# Patient Record
Sex: Female | Born: 1940
Health system: Southern US, Community
[De-identification: ages and names within clinical notes are randomized; demographics above are authoritative.]

## PROBLEM LIST (undated history)

## (undated) DIAGNOSIS — R072 Precordial pain: Secondary | ICD-10-CM

## (undated) DIAGNOSIS — R0602 Shortness of breath: Secondary | ICD-10-CM

## (undated) DIAGNOSIS — K219 Gastro-esophageal reflux disease without esophagitis: Secondary | ICD-10-CM

## (undated) DIAGNOSIS — M549 Dorsalgia, unspecified: Secondary | ICD-10-CM

## (undated) HISTORY — DX: Shortness of breath: R06.02

## (undated) HISTORY — DX: Dorsalgia, unspecified: M54.9

## (undated) HISTORY — PX: TONSILLECTOMY: SUR1361

## (undated) HISTORY — DX: Gastro-esophageal reflux disease without esophagitis: K21.9

## (undated) HISTORY — DX: Precordial pain: R07.2

---

## 1997-10-09 ENCOUNTER — Ambulatory Visit (HOSPITAL_COMMUNITY): Admission: RE | Admit: 1997-10-09 | Discharge: 1997-10-09 | Payer: Self-pay | Admitting: Obstetrics and Gynecology

## 1998-07-09 ENCOUNTER — Ambulatory Visit (HOSPITAL_COMMUNITY): Admission: RE | Admit: 1998-07-09 | Discharge: 1998-07-09 | Payer: Self-pay | Admitting: Family Medicine

## 1998-07-09 ENCOUNTER — Encounter: Payer: Self-pay | Admitting: Family Medicine

## 1999-01-21 ENCOUNTER — Ambulatory Visit (HOSPITAL_COMMUNITY): Admission: RE | Admit: 1999-01-21 | Discharge: 1999-01-21 | Payer: Self-pay | Admitting: Obstetrics and Gynecology

## 1999-01-21 ENCOUNTER — Encounter: Payer: Self-pay | Admitting: Obstetrics and Gynecology

## 1999-05-12 ENCOUNTER — Encounter: Payer: Self-pay | Admitting: Cardiology

## 1999-07-26 ENCOUNTER — Other Ambulatory Visit: Admission: RE | Admit: 1999-07-26 | Discharge: 1999-07-26 | Payer: Self-pay | Admitting: Obstetrics and Gynecology

## 2000-08-14 ENCOUNTER — Encounter: Payer: Self-pay | Admitting: Obstetrics and Gynecology

## 2000-08-14 ENCOUNTER — Ambulatory Visit (HOSPITAL_COMMUNITY): Admission: RE | Admit: 2000-08-14 | Discharge: 2000-08-14 | Payer: Self-pay | Admitting: Obstetrics and Gynecology

## 2001-01-23 ENCOUNTER — Inpatient Hospital Stay (HOSPITAL_COMMUNITY): Admission: EM | Admit: 2001-01-23 | Discharge: 2001-01-24 | Payer: Self-pay | Admitting: Emergency Medicine

## 2001-01-23 ENCOUNTER — Encounter: Payer: Self-pay | Admitting: Emergency Medicine

## 2001-01-24 ENCOUNTER — Encounter: Payer: Self-pay | Admitting: Internal Medicine

## 2001-08-22 ENCOUNTER — Ambulatory Visit (HOSPITAL_COMMUNITY): Admission: RE | Admit: 2001-08-22 | Discharge: 2001-08-22 | Payer: Self-pay | Admitting: Obstetrics and Gynecology

## 2001-08-22 ENCOUNTER — Encounter: Payer: Self-pay | Admitting: Obstetrics and Gynecology

## 2002-10-21 ENCOUNTER — Ambulatory Visit (HOSPITAL_COMMUNITY): Admission: RE | Admit: 2002-10-21 | Discharge: 2002-10-21 | Payer: Self-pay | Admitting: Internal Medicine

## 2002-10-21 ENCOUNTER — Encounter: Payer: Self-pay | Admitting: Obstetrics and Gynecology

## 2003-10-12 ENCOUNTER — Ambulatory Visit (HOSPITAL_COMMUNITY): Admission: RE | Admit: 2003-10-12 | Discharge: 2003-10-12 | Payer: Self-pay | Admitting: Gastroenterology

## 2004-12-26 ENCOUNTER — Ambulatory Visit (HOSPITAL_COMMUNITY): Admission: RE | Admit: 2004-12-26 | Discharge: 2004-12-26 | Payer: Self-pay | Admitting: Obstetrics and Gynecology

## 2006-02-15 ENCOUNTER — Ambulatory Visit (HOSPITAL_COMMUNITY): Admission: RE | Admit: 2006-02-15 | Discharge: 2006-02-15 | Payer: Self-pay | Admitting: Obstetrics and Gynecology

## 2008-01-06 ENCOUNTER — Ambulatory Visit (HOSPITAL_COMMUNITY): Admission: RE | Admit: 2008-01-06 | Discharge: 2008-01-06 | Payer: Self-pay | Admitting: Obstetrics & Gynecology

## 2008-03-17 ENCOUNTER — Encounter: Payer: Self-pay | Admitting: Cardiology

## 2008-09-15 ENCOUNTER — Encounter: Payer: Self-pay | Admitting: Cardiology

## 2008-10-05 ENCOUNTER — Encounter (INDEPENDENT_AMBULATORY_CARE_PROVIDER_SITE_OTHER): Payer: Self-pay | Admitting: *Deleted

## 2008-10-05 ENCOUNTER — Telehealth (INDEPENDENT_AMBULATORY_CARE_PROVIDER_SITE_OTHER): Payer: Self-pay | Admitting: *Deleted

## 2008-10-05 ENCOUNTER — Ambulatory Visit: Payer: Self-pay | Admitting: Cardiology

## 2008-10-05 DIAGNOSIS — R072 Precordial pain: Secondary | ICD-10-CM | POA: Insufficient documentation

## 2008-10-05 DIAGNOSIS — R0602 Shortness of breath: Secondary | ICD-10-CM | POA: Insufficient documentation

## 2008-10-05 DIAGNOSIS — M549 Dorsalgia, unspecified: Secondary | ICD-10-CM | POA: Insufficient documentation

## 2008-10-05 DIAGNOSIS — E785 Hyperlipidemia, unspecified: Secondary | ICD-10-CM | POA: Insufficient documentation

## 2008-10-06 ENCOUNTER — Ambulatory Visit: Payer: Self-pay

## 2008-10-06 ENCOUNTER — Ambulatory Visit: Payer: Self-pay | Admitting: Cardiology

## 2008-10-06 ENCOUNTER — Encounter: Payer: Self-pay | Admitting: Cardiology

## 2008-10-12 LAB — CONVERTED CEMR LAB: Pro B Natriuretic peptide (BNP): 102 pg/mL — ABNORMAL HIGH (ref 0.0–100.0)

## 2008-11-10 ENCOUNTER — Ambulatory Visit: Payer: Self-pay | Admitting: Cardiology

## 2008-11-16 ENCOUNTER — Encounter: Payer: Self-pay | Admitting: Cardiology

## 2008-11-16 ENCOUNTER — Ambulatory Visit: Payer: Self-pay | Admitting: Cardiology

## 2009-02-12 ENCOUNTER — Ambulatory Visit (HOSPITAL_COMMUNITY): Admission: RE | Admit: 2009-02-12 | Discharge: 2009-02-12 | Payer: Self-pay | Admitting: Obstetrics & Gynecology

## 2010-01-27 ENCOUNTER — Other Ambulatory Visit: Payer: Self-pay | Admitting: Obstetrics and Gynecology

## 2010-01-27 DIAGNOSIS — Z0000 Encounter for general adult medical examination without abnormal findings: Secondary | ICD-10-CM

## 2010-02-16 ENCOUNTER — Ambulatory Visit (HOSPITAL_COMMUNITY)
Admission: RE | Admit: 2010-02-16 | Discharge: 2010-02-16 | Disposition: A | Discharge status: Home / Self Care | Payer: Medicare Other | Source: Ambulatory Visit | Attending: Obstetrics and Gynecology | Admitting: Obstetrics and Gynecology

## 2010-02-16 ENCOUNTER — Ambulatory Visit (HOSPITAL_COMMUNITY): Admission: RE | Admit: 2010-02-16 | Payer: Self-pay | Source: Home / Self Care | Admitting: Obstetrics and Gynecology

## 2010-02-16 DIAGNOSIS — Z0000 Encounter for general adult medical examination without abnormal findings: Secondary | ICD-10-CM

## 2010-02-16 DIAGNOSIS — Z1231 Encounter for screening mammogram for malignant neoplasm of breast: Secondary | ICD-10-CM | POA: Insufficient documentation

## 2010-05-27 NOTE — Consult Note (Signed)
[place]. [facility]  Patient:    GETHSEMANE, FISCHLER Visit Number: [id number] MRN: [id number]          Service Type: MED Location: [phone] Attending Physician:  Miguel Aschoff Dictated by:   Darci Needle, M.D. Proc. Date: 01/24/01 Admit Date:  01/23/2001 Discharge Date: 01/24/2001   CC:         Al Decant. Janey Greaser, M.D.  Rosanne Sack, M.D.   Consultation Report  CONCLUSIONS: 1. Severe chest pain radiating through to the back, etiology uncertain.    Negative exercise treadmill test in 1999 - Dr. Corliss Marcus. 2. History of gastroesophageal reflux disease.  RECOMMENDATIONS: 1. Stress Cardiolite today.  Will need to discontinue the nitroglycerin paste    prior to the Cardiolite. 2. Aspirin for now empiric instead of adding antithrombotic therapy, based    upon clinical course and findings of Cardiolite. 3. Dr. Corliss Marcus will follow up later today pending the Cardiolite results.  COMMENTS:  The patient is 70 years of age and was brought to the emergency room after suddenly developing a severe chest discomfort that radiated through to her back.  She otherwise poorly characterizes the discomfort other than to say that is was severe.  There was no radiation to the arms and neck.  There was no shortness of breath.  Lying down seemed to make it feel better.  There was no respiratory component to the discomfort.  The discomfort lasted noticeably until she arrived in the emergency room.  Eventually she received a nitroglycerin and within 10-15 minutes after that the discomfort had resolved. EKGs were normal.  She was seen by Dr. Jamie Brookes, who admitted her to the hospital.  A CT scan did not reveal evidence of pulmonary emboli or aortic dissection.  She has no personal history of hypertension, diabetes, cigarette smoking, or hypercholesterolemia.  ALLERGIES:  PENICILLIN.  PHYSICAL EXAMINATION:  GENERAL:  Patient is lying flat in  bed.  Her color is good.  VITAL SIGNS:  Respirations 16, heart rate 80, blood pressure 140/70.  NECK:  The neck veins are not distended.  No carotid bruits are heard.  The carotid upstroke is 2+ bilaterally.  LUNGS:  Clear to auscultation and percussion.  CARDIAC:  Reveals no click, no rub, no gallop.  ABDOMEN:  Soft.  EXTREMITIES:  Reveal no edema.  Femoral pulses are 2+.  LABORATORY DATA:  EKG is normal x 3.  CK-MB and troponin I are normal.  Chest x-ray is unremarkable. Dictated by:   Darci Needle, M.D. Attending Physician:  Miguel Aschoff DD:  01/24/01 TD:  01/25/01 Job: [id number]

## 2010-05-27 NOTE — H&P (Signed)
[place]. [facility]  Patient:    Shannon [name], GOVER Visit Number: [id number] MRN: [id number]          Service Type: Attending:  Rosanne Sack, M.D. Dictated by:   Rosanne Sack, M.D. Adm. Date:  01/23/01   CC:         Al Decant. Janey Greaser, M.D.  Francisca December, M.D.   History and Physical  DATE OF BIRTH: [birth year]  PROBLEM LIST:  1. Chest pain.  Rule out pulmonary embolus, rule out myocardial infarction.     a. History of negative stress test (treadmill) about three years ago.  2. Chronic lower extremity pain since trauma in July 2002.     a. Negative fracture.  Evaluated and treated by an orthopedic surgeon        in July 2002.  3. Gastroesophageal reflux disease.  4. Osteoporosis.  5. Allergy to PENICILLIN (hives).  CHIEF COMPLAINT: Chest pain.  HISTORY OF PRESENT ILLNESS: Ms. Hardgrave is a very pleasant 70 year old female, who presents with complaints of substernal chest pain that started this afternoon while she was standing up in the kitchen.  The patient was preparing bread when she developed severe chest pain, dull in nature, that radiated to the back.  She experienced shortness of breath but no palpitations or nausea. The pain lasted about 45 minutes and was relieved completely with one sublingual nitroglycerin given by EMS.  The patient denies lower extremity swelling, recent trips, dyspnea on exertion, cough, chest congestion, fever, chills, orthopnea.  The patient describes intermittent left lower distal tibial pain since her accident in July 2002.  The pain is mild in nature and was being labeled as a neuralgia type pain.  No hemoptysis, hematemesis, melena, tarry stools, bright red blood per rectum.  No urinary symptoms.  No skin rash, no syncope, no focal weakness, or swallowing problems.  No amaurosis fugax.  PAST MEDICAL HISTORY: As per problem list.  ALLERGIES: As per problem list.  MEDICATIONS:  1. Premarin 0.625  mg p.o. q.d.  2. Actonel 5 mg p.o. q.d.  SOCIAL HISTORY: The patient is married and has two children, ages 2 and 66. She works as an Airline pilot.  She drinks occasionally.  No smoking.  FAMILY HISTORY: The patients father had an MI at the age of 37.  No diabetes, hypertension, or strokes in the family.  Her father had pancreatic cancer.  REVIEW OF SYSTEMS: As per HPI.  PHYSICAL EXAMINATION:  VITAL SIGNS: Temperature 97.2, blood pressure 121/60, heart rate 68, respiration rate 17.  Oxygen saturation 96% on room air.  HEENT: Normocephalic, atraumatic.  Sclerae nonicteric.  Conjunctivae within normal limits.  PERRLA.  EOMI.  Funduscopic examination negative for papilledema or hemorrhages.  TMs within normal limits.  Moist mucous membranes.  Oropharynx clear.  NECK: Supple.  No JVD, no bruits.  No adenopathy, no thyromegaly.  LUNGS: Clear to auscultation bilaterally without crackles, wheezes.  Fair air movement bilaterally.  CARDIAC: Regular rate and rhythm without murmurs, rubs, gallops.  Normal S1 and S2.  ABDOMEN: Flat, nontender, nondistended.  Bowel sounds present.  No hepatosplenomegaly.  No rebound, no guarding, no masses, no bruits.  BREAST: Within normal limits.  GU: Within normal limits.  RECTAL: Empty vault.  Normal sphincter tone.  No masses.  EXTREMITIES: No edema, clubbing or cyanosis.  Pulses 2+ bilaterally.  NEUROLOGIC: Alert and oriented x3.  Strength 5/5 in extremities.  DTRs 3/5 in extremities.  Cranial nerves II-XII intact.  Sensory intact.  Plantar  flexion downgoing bilaterally.  LABORATORY DATA: EKG, normal sinus rhythm, no ST segment changes, no Q waves.  Troponin I 0.01.  CK 104.  CK-MB 2.2.  Sodium 135, potassium 3.9, chloride 103, CO2 pending, BUN 8, creatinine 1.0.  Hemoglobin 12.4, MCV 90, WBC 5.8, no left shift; platelets 232,000.  Chest x-ray negative.  ASSESSMENT/PLAN:  1. Chest pain.  Rule out myocardial infarction.  The patient  presents with     chest pain that was relieved with sublingual nitroglycerin.  Currently     there is no evidence of swelling in the lower extremities, no hypoxia.     The electrocardiogram is negative and the cardiac enzymes are negative as     well.  Given the atypical onset of symptoms pulmonary embolus cannot be     ruled out despite a lack of existence of hypoxia.  A CAT scan of the chest     and lower extremities will be obtained.  Also, cardiac enzymes will be     obtained every eight hours.  I spoke with Dr. Garnette Scheuermann about this case,     and he agreed with a Cardiolite in the morning if the chest CT scan is     negative.  In the meantime, will use aspirin and nitrates.  If deep vein     thrombosis or pulmonary embolus were to be noticed, heparin or Lovenox     will be started.  In the meantime, will provide with oxygen     supplementation.  2. Gastroesophageal reflux disease.  Will resume Proton pump inhibitor and     follow clinically.  The patient had heartburn around Thanksgiving.  The     patient thinks that the nature of the pain that she presented with today     is completely nonrelated to the gastroesophageal reflux disease symptoms.  3. Chronic intermittent lower extremity pain.  The degree of this pain is     mild and not related to ambulation.  It sounds to [place] like neuralgia type     symptoms since her distal tibial trauma.  There is no evidence of cord     signs or edema.  The pulses are 2+.  Will follow up the patients lower     extremities with a CAT scan to try to see if there is any evidence of     deep vein thrombosis or bony abnormality.  4. Osteoporosis.  Will hold Actonel for now while in the hospital. Dictated by:   Rosanne Sack, M.D. Attending:  Rosanne Sack, M.D. DD:  01/24/01 TD:  01/24/01 Job: [id number]

## 2010-05-27 NOTE — Op Note (Signed)
NAMEMARTINA, [name]                   ACCOUNT NO.:  [id number]   MEDICAL RECORD NO.:  [id number]          PATIENT TYPE:  AMB   LOCATION:  ENDO                         FACILITY:  [facility]   PHYSICIAN:  John C. Madilyn Fireman, M.D.    DATE OF BIRTH:  April 30, 1940   DATE OF PROCEDURE:  10/12/2003  DATE OF DISCHARGE:                                 OPERATIVE REPORT   PROCEDURE:  Colonoscopy.   INDICATION FOR PROCEDURE:  Average risk colon cancer screening in a 70-year-  old patient with no previous screening.   DESCRIPTION OF PROCEDURE:  The patient was placed in the left lateral  decubitus position and placed on the pulse monitor with continuous low-flow  oxygen delivered by nasal cannula.  She was sedated with 50 mcg IV fentanyl  and 3 mg IV Versed, in addition to the medicine given for the previous EGD.  The Olympus video colonoscope was inserted into the rectum and advanced its  entire length.  The colon seemed long and tortuous and despite multiple  position changes, abdominal pressure, and torquing maneuvers, I could not  reach the cecum.  It was not certain but felt that point of furthest  advancement was somewhere near the hepatic flexure.  The mucosa in this area  appeared normal as did the transverse colon.  At one juncture that I  estimated to be possibly near the splenic flexure, there was noted some  scope trauma on withdrawal and evulsion with some of the submucosa visible.  There did not appear to be any full-thickness perforation.  The descending,  sigmoid, and the rectum appeared normal with no masses, polyps, or  diverticula.  The scope was then withdrawn and the patient returned to the  recovery room in stable condition.  She tolerated the procedure well, and  there were no immediate complications.   IMPRESSION:  Normal incomplete colonoscopy to approximately the hepatic  flexure.   PLAN:  We will consider barium enema at some point or possibly Hemoccults to  complete her colon  screening.      JCH/MEDQ  D:  10/12/2003  T:  10/12/2003  Job:  [id number]   cc:   Chales Salmon. Abigail Miyamoto, M.D.  [address]  [place]  [place] [address]  Fax: [phone]

## 2010-05-27 NOTE — Op Note (Signed)
Shannon [name], Shannon [name]                   ACCOUNT NO.:  [id number]   MEDICAL RECORD NO.:  [id number]          PATIENT TYPE:  AMB   LOCATION:  ENDO                         FACILITY:  [facility]   PHYSICIAN:  John C. Madilyn Fireman, M.D.    DATE OF BIRTH:  1940/06/17   DATE OF PROCEDURE:  10/12/2003  DATE OF DISCHARGE:                                 OPERATIVE REPORT   PROCEDURE:  Esophagogastroduodenoscopy.   INDICATION FOR PROCEDURE:  Chronic reflux symptoms with recent persistent  odynophagia and suboptimal control on a proton pump inhibitor.   PROCEDURE:  The patient was placed in the left lateral decubitus position  and placed on the pulse monitor, with continuous low-flow oxygen delivered  by nasal cannula.  She was sedated with 12.5 mcg IV fentanyl and 4 mg IV  Versed.  The Olympus video endoscope was advanced under direct vision into  the oropharynx and esophagus.  The esophagus was straight and of normal  caliber, with the squamocolumnar line at 38 cm.  There was a 1.5-2 cm  sliding hiatal hernia.  The squamocolumnar line was slightly irregular, but  there were no discrete erosions and no visible ring or stricture.  The  stomach was entered, and a small amount of liquid secretions were suctioned  from the fundus.  Retroflexed view of the cardia was unremarkable.  The  fundus, body, antrum, and pylorus all appeared normal.  The duodenum was  entered, and both the bulb and the second portion were well inspected and  appeared to be within normal limits.  The scope was then withdrawn and the  patient prepared for colonoscopy for general screening.   IMPRESSION:  Hiatal hernia.  Otherwise normal study.   PLAN:  Continue medical and dietary treatment for reflux.      JCH/MEDQ  D:  10/12/2003  T:  10/12/2003  Job:  [id number]   cc:   Chales Salmon. Abigail Miyamoto, M.D.  [address]  [place]  [place] [address]  Fax: [phone]

## 2011-02-13 ENCOUNTER — Other Ambulatory Visit: Payer: Self-pay | Admitting: Obstetrics and Gynecology

## 2011-02-13 DIAGNOSIS — Z1231 Encounter for screening mammogram for malignant neoplasm of breast: Secondary | ICD-10-CM

## 2011-03-10 ENCOUNTER — Ambulatory Visit (HOSPITAL_COMMUNITY)
Admission: RE | Admit: 2011-03-10 | Discharge: 2011-03-10 | Disposition: A | Discharge status: Home / Self Care | Payer: Medicare Other | Source: Ambulatory Visit | Attending: Obstetrics and Gynecology | Admitting: Obstetrics and Gynecology

## 2011-03-10 DIAGNOSIS — Z1231 Encounter for screening mammogram for malignant neoplasm of breast: Secondary | ICD-10-CM

## 2011-07-27 ENCOUNTER — Encounter: Payer: Self-pay | Admitting: Cardiology

## 2014-03-02 DIAGNOSIS — Z79899 Other long term (current) drug therapy: Secondary | ICD-10-CM | POA: Diagnosis not present

## 2014-03-02 DIAGNOSIS — L401 Generalized pustular psoriasis: Secondary | ICD-10-CM | POA: Diagnosis not present

## 2014-03-02 DIAGNOSIS — M79605 Pain in left leg: Secondary | ICD-10-CM | POA: Diagnosis not present

## 2014-03-02 DIAGNOSIS — M0589 Other rheumatoid arthritis with rheumatoid factor of multiple sites: Secondary | ICD-10-CM | POA: Diagnosis not present

## 2014-03-04 DIAGNOSIS — H2513 Age-related nuclear cataract, bilateral: Secondary | ICD-10-CM | POA: Diagnosis not present

## 2014-03-04 DIAGNOSIS — I708 Atherosclerosis of other arteries: Secondary | ICD-10-CM | POA: Diagnosis not present

## 2014-03-04 DIAGNOSIS — Z79899 Other long term (current) drug therapy: Secondary | ICD-10-CM | POA: Diagnosis not present

## 2014-03-04 DIAGNOSIS — H40013 Open angle with borderline findings, low risk, bilateral: Secondary | ICD-10-CM | POA: Diagnosis not present

## 2014-04-17 DIAGNOSIS — Z79899 Other long term (current) drug therapy: Secondary | ICD-10-CM | POA: Diagnosis not present

## 2014-05-15 DIAGNOSIS — N760 Acute vaginitis: Secondary | ICD-10-CM | POA: Diagnosis not present

## 2014-05-15 DIAGNOSIS — E782 Mixed hyperlipidemia: Secondary | ICD-10-CM | POA: Diagnosis not present

## 2014-05-15 DIAGNOSIS — M8580 Other specified disorders of bone density and structure, unspecified site: Secondary | ICD-10-CM | POA: Diagnosis not present

## 2014-05-15 DIAGNOSIS — K5900 Constipation, unspecified: Secondary | ICD-10-CM | POA: Diagnosis not present

## 2014-05-15 DIAGNOSIS — M0579 Rheumatoid arthritis with rheumatoid factor of multiple sites without organ or systems involvement: Secondary | ICD-10-CM | POA: Diagnosis not present

## 2014-05-28 DIAGNOSIS — L821 Other seborrheic keratosis: Secondary | ICD-10-CM | POA: Diagnosis not present

## 2014-05-28 DIAGNOSIS — D239 Other benign neoplasm of skin, unspecified: Secondary | ICD-10-CM | POA: Diagnosis not present

## 2014-06-01 DIAGNOSIS — Z79899 Other long term (current) drug therapy: Secondary | ICD-10-CM | POA: Diagnosis not present

## 2014-06-01 DIAGNOSIS — L401 Generalized pustular psoriasis: Secondary | ICD-10-CM | POA: Diagnosis not present

## 2014-06-01 DIAGNOSIS — M79605 Pain in left leg: Secondary | ICD-10-CM | POA: Diagnosis not present

## 2014-06-01 DIAGNOSIS — M0589 Other rheumatoid arthritis with rheumatoid factor of multiple sites: Secondary | ICD-10-CM | POA: Diagnosis not present

## 2014-07-01 DIAGNOSIS — H01009 Unspecified blepharitis unspecified eye, unspecified eyelid: Secondary | ICD-10-CM | POA: Diagnosis not present

## 2014-07-01 DIAGNOSIS — H01003 Unspecified blepharitis right eye, unspecified eyelid: Secondary | ICD-10-CM | POA: Diagnosis not present

## 2014-07-23 DIAGNOSIS — H01009 Unspecified blepharitis unspecified eye, unspecified eyelid: Secondary | ICD-10-CM | POA: Diagnosis not present

## 2014-07-23 DIAGNOSIS — H01003 Unspecified blepharitis right eye, unspecified eyelid: Secondary | ICD-10-CM | POA: Diagnosis not present

## 2014-07-23 DIAGNOSIS — H029 Unspecified disorder of eyelid: Secondary | ICD-10-CM | POA: Diagnosis not present

## 2014-07-23 DIAGNOSIS — H0015 Chalazion left lower eyelid: Secondary | ICD-10-CM | POA: Diagnosis not present

## 2014-09-30 DIAGNOSIS — M25561 Pain in right knee: Secondary | ICD-10-CM | POA: Diagnosis not present

## 2014-09-30 DIAGNOSIS — G629 Polyneuropathy, unspecified: Secondary | ICD-10-CM | POA: Diagnosis not present

## 2014-09-30 DIAGNOSIS — L401 Generalized pustular psoriasis: Secondary | ICD-10-CM | POA: Diagnosis not present

## 2014-09-30 DIAGNOSIS — M0589 Other rheumatoid arthritis with rheumatoid factor of multiple sites: Secondary | ICD-10-CM | POA: Diagnosis not present

## 2014-11-03 DIAGNOSIS — Z1231 Encounter for screening mammogram for malignant neoplasm of breast: Secondary | ICD-10-CM | POA: Diagnosis not present

## 2014-11-16 DIAGNOSIS — R8290 Unspecified abnormal findings in urine: Secondary | ICD-10-CM | POA: Diagnosis not present

## 2014-11-16 DIAGNOSIS — K5900 Constipation, unspecified: Secondary | ICD-10-CM | POA: Diagnosis not present

## 2014-11-16 DIAGNOSIS — M0579 Rheumatoid arthritis with rheumatoid factor of multiple sites without organ or systems involvement: Secondary | ICD-10-CM | POA: Diagnosis not present

## 2014-11-16 DIAGNOSIS — M8580 Other specified disorders of bone density and structure, unspecified site: Secondary | ICD-10-CM | POA: Diagnosis not present

## 2014-11-16 DIAGNOSIS — E782 Mixed hyperlipidemia: Secondary | ICD-10-CM | POA: Diagnosis not present

## 2015-01-29 DIAGNOSIS — M0589 Other rheumatoid arthritis with rheumatoid factor of multiple sites: Secondary | ICD-10-CM | POA: Diagnosis not present

## 2015-01-29 DIAGNOSIS — M25561 Pain in right knee: Secondary | ICD-10-CM | POA: Diagnosis not present

## 2015-01-29 DIAGNOSIS — G629 Polyneuropathy, unspecified: Secondary | ICD-10-CM | POA: Diagnosis not present

## 2015-01-29 DIAGNOSIS — L401 Generalized pustular psoriasis: Secondary | ICD-10-CM | POA: Diagnosis not present

## 2015-02-01 ENCOUNTER — Other Ambulatory Visit: Payer: Self-pay | Admitting: Physician Assistant

## 2015-02-01 DIAGNOSIS — M25561 Pain in right knee: Secondary | ICD-10-CM

## 2015-02-06 ENCOUNTER — Ambulatory Visit
Admission: RE | Admit: 2015-02-06 | Discharge: 2015-02-06 | Disposition: A | Discharge status: Home / Self Care | Payer: Medicare Other | Source: Ambulatory Visit | Attending: Rheumatology | Admitting: Rheumatology

## 2015-02-06 DIAGNOSIS — M25561 Pain in right knee: Secondary | ICD-10-CM

## 2015-02-06 DIAGNOSIS — S83281A Other tear of lateral meniscus, current injury, right knee, initial encounter: Secondary | ICD-10-CM | POA: Diagnosis not present

## 2015-02-11 DIAGNOSIS — R2681 Unsteadiness on feet: Secondary | ICD-10-CM | POA: Diagnosis not present

## 2015-02-11 DIAGNOSIS — M6281 Muscle weakness (generalized): Secondary | ICD-10-CM | POA: Diagnosis not present

## 2015-02-11 DIAGNOSIS — R262 Difficulty in walking, not elsewhere classified: Secondary | ICD-10-CM | POA: Diagnosis not present

## 2015-02-16 DIAGNOSIS — M6281 Muscle weakness (generalized): Secondary | ICD-10-CM | POA: Diagnosis not present

## 2015-02-16 DIAGNOSIS — R262 Difficulty in walking, not elsewhere classified: Secondary | ICD-10-CM | POA: Diagnosis not present

## 2015-02-16 DIAGNOSIS — R2681 Unsteadiness on feet: Secondary | ICD-10-CM | POA: Diagnosis not present

## 2015-02-18 DIAGNOSIS — M6281 Muscle weakness (generalized): Secondary | ICD-10-CM | POA: Diagnosis not present

## 2015-02-18 DIAGNOSIS — R262 Difficulty in walking, not elsewhere classified: Secondary | ICD-10-CM | POA: Diagnosis not present

## 2015-02-18 DIAGNOSIS — R2681 Unsteadiness on feet: Secondary | ICD-10-CM | POA: Diagnosis not present

## 2015-02-23 DIAGNOSIS — R2681 Unsteadiness on feet: Secondary | ICD-10-CM | POA: Diagnosis not present

## 2015-02-23 DIAGNOSIS — R262 Difficulty in walking, not elsewhere classified: Secondary | ICD-10-CM | POA: Diagnosis not present

## 2015-02-23 DIAGNOSIS — M6281 Muscle weakness (generalized): Secondary | ICD-10-CM | POA: Diagnosis not present

## 2015-02-25 DIAGNOSIS — M6281 Muscle weakness (generalized): Secondary | ICD-10-CM | POA: Diagnosis not present

## 2015-02-25 DIAGNOSIS — R2681 Unsteadiness on feet: Secondary | ICD-10-CM | POA: Diagnosis not present

## 2015-02-25 DIAGNOSIS — R262 Difficulty in walking, not elsewhere classified: Secondary | ICD-10-CM | POA: Diagnosis not present

## 2015-03-02 DIAGNOSIS — M6281 Muscle weakness (generalized): Secondary | ICD-10-CM | POA: Diagnosis not present

## 2015-03-02 DIAGNOSIS — R2681 Unsteadiness on feet: Secondary | ICD-10-CM | POA: Diagnosis not present

## 2015-03-02 DIAGNOSIS — R262 Difficulty in walking, not elsewhere classified: Secondary | ICD-10-CM | POA: Diagnosis not present

## 2015-03-04 DIAGNOSIS — M6281 Muscle weakness (generalized): Secondary | ICD-10-CM | POA: Diagnosis not present

## 2015-03-04 DIAGNOSIS — R262 Difficulty in walking, not elsewhere classified: Secondary | ICD-10-CM | POA: Diagnosis not present

## 2015-03-04 DIAGNOSIS — R2681 Unsteadiness on feet: Secondary | ICD-10-CM | POA: Diagnosis not present

## 2015-03-09 DIAGNOSIS — R2681 Unsteadiness on feet: Secondary | ICD-10-CM | POA: Diagnosis not present

## 2015-03-09 DIAGNOSIS — R262 Difficulty in walking, not elsewhere classified: Secondary | ICD-10-CM | POA: Diagnosis not present

## 2015-03-09 DIAGNOSIS — M6281 Muscle weakness (generalized): Secondary | ICD-10-CM | POA: Diagnosis not present

## 2015-03-11 DIAGNOSIS — H01003 Unspecified blepharitis right eye, unspecified eyelid: Secondary | ICD-10-CM | POA: Diagnosis not present

## 2015-03-11 DIAGNOSIS — H40013 Open angle with borderline findings, low risk, bilateral: Secondary | ICD-10-CM | POA: Diagnosis not present

## 2015-03-11 DIAGNOSIS — R2681 Unsteadiness on feet: Secondary | ICD-10-CM | POA: Diagnosis not present

## 2015-03-11 DIAGNOSIS — R262 Difficulty in walking, not elsewhere classified: Secondary | ICD-10-CM | POA: Diagnosis not present

## 2015-03-11 DIAGNOSIS — Z79899 Other long term (current) drug therapy: Secondary | ICD-10-CM | POA: Diagnosis not present

## 2015-03-11 DIAGNOSIS — H01009 Unspecified blepharitis unspecified eye, unspecified eyelid: Secondary | ICD-10-CM | POA: Diagnosis not present

## 2015-03-11 DIAGNOSIS — H524 Presbyopia: Secondary | ICD-10-CM | POA: Diagnosis not present

## 2015-03-11 DIAGNOSIS — M6281 Muscle weakness (generalized): Secondary | ICD-10-CM | POA: Diagnosis not present

## 2015-03-16 DIAGNOSIS — R262 Difficulty in walking, not elsewhere classified: Secondary | ICD-10-CM | POA: Diagnosis not present

## 2015-03-16 DIAGNOSIS — R2681 Unsteadiness on feet: Secondary | ICD-10-CM | POA: Diagnosis not present

## 2015-03-16 DIAGNOSIS — M6281 Muscle weakness (generalized): Secondary | ICD-10-CM | POA: Diagnosis not present

## 2015-03-18 DIAGNOSIS — M6281 Muscle weakness (generalized): Secondary | ICD-10-CM | POA: Diagnosis not present

## 2015-03-18 DIAGNOSIS — R262 Difficulty in walking, not elsewhere classified: Secondary | ICD-10-CM | POA: Diagnosis not present

## 2015-03-18 DIAGNOSIS — R2681 Unsteadiness on feet: Secondary | ICD-10-CM | POA: Diagnosis not present

## 2015-03-23 DIAGNOSIS — R2681 Unsteadiness on feet: Secondary | ICD-10-CM | POA: Diagnosis not present

## 2015-03-23 DIAGNOSIS — M6281 Muscle weakness (generalized): Secondary | ICD-10-CM | POA: Diagnosis not present

## 2015-03-23 DIAGNOSIS — R262 Difficulty in walking, not elsewhere classified: Secondary | ICD-10-CM | POA: Diagnosis not present

## 2015-03-26 DIAGNOSIS — R2681 Unsteadiness on feet: Secondary | ICD-10-CM | POA: Diagnosis not present

## 2015-03-26 DIAGNOSIS — M6281 Muscle weakness (generalized): Secondary | ICD-10-CM | POA: Diagnosis not present

## 2015-03-26 DIAGNOSIS — R262 Difficulty in walking, not elsewhere classified: Secondary | ICD-10-CM | POA: Diagnosis not present

## 2015-03-30 DIAGNOSIS — R262 Difficulty in walking, not elsewhere classified: Secondary | ICD-10-CM | POA: Diagnosis not present

## 2015-03-30 DIAGNOSIS — R2681 Unsteadiness on feet: Secondary | ICD-10-CM | POA: Diagnosis not present

## 2015-03-30 DIAGNOSIS — M6281 Muscle weakness (generalized): Secondary | ICD-10-CM | POA: Diagnosis not present

## 2015-04-02 DIAGNOSIS — M6281 Muscle weakness (generalized): Secondary | ICD-10-CM | POA: Diagnosis not present

## 2015-04-02 DIAGNOSIS — R262 Difficulty in walking, not elsewhere classified: Secondary | ICD-10-CM | POA: Diagnosis not present

## 2015-04-02 DIAGNOSIS — R2681 Unsteadiness on feet: Secondary | ICD-10-CM | POA: Diagnosis not present

## 2015-04-21 DIAGNOSIS — Z79899 Other long term (current) drug therapy: Secondary | ICD-10-CM | POA: Diagnosis not present

## 2015-05-04 DIAGNOSIS — D0439 Carcinoma in situ of skin of other parts of face: Secondary | ICD-10-CM | POA: Diagnosis not present

## 2015-05-04 DIAGNOSIS — L821 Other seborrheic keratosis: Secondary | ICD-10-CM | POA: Diagnosis not present

## 2015-05-04 DIAGNOSIS — D239 Other benign neoplasm of skin, unspecified: Secondary | ICD-10-CM | POA: Diagnosis not present

## 2015-05-17 DIAGNOSIS — K5900 Constipation, unspecified: Secondary | ICD-10-CM | POA: Diagnosis not present

## 2015-05-17 DIAGNOSIS — N760 Acute vaginitis: Secondary | ICD-10-CM | POA: Diagnosis not present

## 2015-05-17 DIAGNOSIS — M8580 Other specified disorders of bone density and structure, unspecified site: Secondary | ICD-10-CM | POA: Diagnosis not present

## 2015-05-17 DIAGNOSIS — M0579 Rheumatoid arthritis with rheumatoid factor of multiple sites without organ or systems involvement: Secondary | ICD-10-CM | POA: Diagnosis not present

## 2015-05-17 DIAGNOSIS — E782 Mixed hyperlipidemia: Secondary | ICD-10-CM | POA: Diagnosis not present

## 2015-05-28 DIAGNOSIS — M0589 Other rheumatoid arthritis with rheumatoid factor of multiple sites: Secondary | ICD-10-CM | POA: Diagnosis not present

## 2015-05-28 DIAGNOSIS — L401 Generalized pustular psoriasis: Secondary | ICD-10-CM | POA: Diagnosis not present

## 2015-05-28 DIAGNOSIS — M25561 Pain in right knee: Secondary | ICD-10-CM | POA: Diagnosis not present

## 2015-05-28 DIAGNOSIS — G629 Polyneuropathy, unspecified: Secondary | ICD-10-CM | POA: Diagnosis not present

## 2015-06-10 DIAGNOSIS — L820 Inflamed seborrheic keratosis: Secondary | ICD-10-CM | POA: Diagnosis not present

## 2015-06-10 DIAGNOSIS — D485 Neoplasm of uncertain behavior of skin: Secondary | ICD-10-CM | POA: Diagnosis not present

## 2015-06-10 DIAGNOSIS — D0439 Carcinoma in situ of skin of other parts of face: Secondary | ICD-10-CM | POA: Diagnosis not present

## 2015-09-12 DIAGNOSIS — R110 Nausea: Secondary | ICD-10-CM | POA: Diagnosis not present

## 2015-09-12 DIAGNOSIS — R42 Dizziness and giddiness: Secondary | ICD-10-CM | POA: Diagnosis not present

## 2015-09-12 DIAGNOSIS — N390 Urinary tract infection, site not specified: Secondary | ICD-10-CM | POA: Diagnosis not present

## 2015-09-12 DIAGNOSIS — R51 Headache: Secondary | ICD-10-CM | POA: Diagnosis not present

## 2015-09-17 DIAGNOSIS — L570 Actinic keratosis: Secondary | ICD-10-CM | POA: Diagnosis not present

## 2015-09-17 DIAGNOSIS — L821 Other seborrheic keratosis: Secondary | ICD-10-CM | POA: Diagnosis not present

## 2015-09-29 DIAGNOSIS — G629 Polyneuropathy, unspecified: Secondary | ICD-10-CM | POA: Diagnosis not present

## 2015-09-29 DIAGNOSIS — M25561 Pain in right knee: Secondary | ICD-10-CM | POA: Diagnosis not present

## 2015-09-29 DIAGNOSIS — M0589 Other rheumatoid arthritis with rheumatoid factor of multiple sites: Secondary | ICD-10-CM | POA: Diagnosis not present

## 2015-09-29 DIAGNOSIS — R296 Repeated falls: Secondary | ICD-10-CM | POA: Diagnosis not present

## 2015-09-29 DIAGNOSIS — L401 Generalized pustular psoriasis: Secondary | ICD-10-CM | POA: Diagnosis not present

## 2015-10-26 DIAGNOSIS — Z23 Encounter for immunization: Secondary | ICD-10-CM | POA: Diagnosis not present

## 2015-12-07 DIAGNOSIS — Z1231 Encounter for screening mammogram for malignant neoplasm of breast: Secondary | ICD-10-CM | POA: Diagnosis not present

## 2015-12-13 DIAGNOSIS — R29890 Loss of height: Secondary | ICD-10-CM | POA: Diagnosis not present

## 2015-12-13 DIAGNOSIS — M8580 Other specified disorders of bone density and structure, unspecified site: Secondary | ICD-10-CM | POA: Diagnosis not present

## 2015-12-13 DIAGNOSIS — E559 Vitamin D deficiency, unspecified: Secondary | ICD-10-CM | POA: Diagnosis not present

## 2015-12-13 DIAGNOSIS — M0570 Rheumatoid arthritis with rheumatoid factor of unspecified site without organ or systems involvement: Secondary | ICD-10-CM | POA: Diagnosis not present

## 2016-02-01 DIAGNOSIS — M7062 Trochanteric bursitis, left hip: Secondary | ICD-10-CM | POA: Diagnosis not present

## 2016-02-01 DIAGNOSIS — G629 Polyneuropathy, unspecified: Secondary | ICD-10-CM | POA: Diagnosis not present

## 2016-02-01 DIAGNOSIS — E663 Overweight: Secondary | ICD-10-CM | POA: Diagnosis not present

## 2016-02-01 DIAGNOSIS — L401 Generalized pustular psoriasis: Secondary | ICD-10-CM | POA: Diagnosis not present

## 2016-02-01 DIAGNOSIS — M150 Primary generalized (osteo)arthritis: Secondary | ICD-10-CM | POA: Diagnosis not present

## 2016-02-01 DIAGNOSIS — M25561 Pain in right knee: Secondary | ICD-10-CM | POA: Diagnosis not present

## 2016-02-01 DIAGNOSIS — Z6829 Body mass index (BMI) 29.0-29.9, adult: Secondary | ICD-10-CM | POA: Diagnosis not present

## 2016-02-01 DIAGNOSIS — M0589 Other rheumatoid arthritis with rheumatoid factor of multiple sites: Secondary | ICD-10-CM | POA: Diagnosis not present

## 2016-03-22 DIAGNOSIS — Z79899 Other long term (current) drug therapy: Secondary | ICD-10-CM | POA: Diagnosis not present

## 2016-03-22 DIAGNOSIS — H25013 Cortical age-related cataract, bilateral: Secondary | ICD-10-CM | POA: Diagnosis not present

## 2016-03-22 DIAGNOSIS — H40013 Open angle with borderline findings, low risk, bilateral: Secondary | ICD-10-CM | POA: Diagnosis not present

## 2016-03-22 DIAGNOSIS — H2513 Age-related nuclear cataract, bilateral: Secondary | ICD-10-CM | POA: Diagnosis not present

## 2016-03-23 DIAGNOSIS — N390 Urinary tract infection, site not specified: Secondary | ICD-10-CM | POA: Diagnosis not present

## 2016-04-03 DIAGNOSIS — R29890 Loss of height: Secondary | ICD-10-CM | POA: Diagnosis not present

## 2016-04-03 DIAGNOSIS — M810 Age-related osteoporosis without current pathological fracture: Secondary | ICD-10-CM | POA: Diagnosis not present

## 2016-04-03 DIAGNOSIS — Z780 Asymptomatic menopausal state: Secondary | ICD-10-CM | POA: Diagnosis not present

## 2016-04-26 DIAGNOSIS — Z79899 Other long term (current) drug therapy: Secondary | ICD-10-CM | POA: Diagnosis not present

## 2016-04-26 DIAGNOSIS — H2513 Age-related nuclear cataract, bilateral: Secondary | ICD-10-CM | POA: Diagnosis not present

## 2016-04-26 DIAGNOSIS — H40013 Open angle with borderline findings, low risk, bilateral: Secondary | ICD-10-CM | POA: Diagnosis not present

## 2016-05-30 DIAGNOSIS — G629 Polyneuropathy, unspecified: Secondary | ICD-10-CM | POA: Diagnosis not present

## 2016-05-30 DIAGNOSIS — E663 Overweight: Secondary | ICD-10-CM | POA: Diagnosis not present

## 2016-05-30 DIAGNOSIS — M7062 Trochanteric bursitis, left hip: Secondary | ICD-10-CM | POA: Diagnosis not present

## 2016-05-30 DIAGNOSIS — M150 Primary generalized (osteo)arthritis: Secondary | ICD-10-CM | POA: Diagnosis not present

## 2016-05-30 DIAGNOSIS — Z6829 Body mass index (BMI) 29.0-29.9, adult: Secondary | ICD-10-CM | POA: Diagnosis not present

## 2016-05-30 DIAGNOSIS — L401 Generalized pustular psoriasis: Secondary | ICD-10-CM | POA: Diagnosis not present

## 2016-05-30 DIAGNOSIS — M25561 Pain in right knee: Secondary | ICD-10-CM | POA: Diagnosis not present

## 2016-05-30 DIAGNOSIS — M0589 Other rheumatoid arthritis with rheumatoid factor of multiple sites: Secondary | ICD-10-CM | POA: Diagnosis not present

## 2016-06-09 DIAGNOSIS — E559 Vitamin D deficiency, unspecified: Secondary | ICD-10-CM | POA: Diagnosis not present

## 2016-06-09 DIAGNOSIS — M810 Age-related osteoporosis without current pathological fracture: Secondary | ICD-10-CM | POA: Diagnosis not present

## 2016-06-09 DIAGNOSIS — E782 Mixed hyperlipidemia: Secondary | ICD-10-CM | POA: Diagnosis not present

## 2016-06-09 DIAGNOSIS — M0570 Rheumatoid arthritis with rheumatoid factor of unspecified site without organ or systems involvement: Secondary | ICD-10-CM | POA: Diagnosis not present

## 2016-06-13 DIAGNOSIS — M4317 Spondylolisthesis, lumbosacral region: Secondary | ICD-10-CM | POA: Diagnosis not present

## 2016-06-13 DIAGNOSIS — M4696 Unspecified inflammatory spondylopathy, lumbar region: Secondary | ICD-10-CM | POA: Diagnosis not present

## 2016-06-13 DIAGNOSIS — M059 Rheumatoid arthritis with rheumatoid factor, unspecified: Secondary | ICD-10-CM | POA: Diagnosis not present

## 2016-06-13 DIAGNOSIS — Z79899 Other long term (current) drug therapy: Secondary | ICD-10-CM | POA: Diagnosis not present

## 2016-06-13 DIAGNOSIS — M5135 Other intervertebral disc degeneration, thoracolumbar region: Secondary | ICD-10-CM | POA: Diagnosis not present

## 2016-06-13 DIAGNOSIS — M069 Rheumatoid arthritis, unspecified: Secondary | ICD-10-CM | POA: Diagnosis not present

## 2016-06-13 DIAGNOSIS — F329 Major depressive disorder, single episode, unspecified: Secondary | ICD-10-CM | POA: Diagnosis not present

## 2016-06-13 DIAGNOSIS — K219 Gastro-esophageal reflux disease without esophagitis: Secondary | ICD-10-CM | POA: Diagnosis not present

## 2016-06-13 DIAGNOSIS — Z780 Asymptomatic menopausal state: Secondary | ICD-10-CM | POA: Diagnosis not present

## 2016-06-13 DIAGNOSIS — M810 Age-related osteoporosis without current pathological fracture: Secondary | ICD-10-CM | POA: Diagnosis not present

## 2016-06-13 DIAGNOSIS — Z7983 Long term (current) use of bisphosphonates: Secondary | ICD-10-CM | POA: Diagnosis not present

## 2016-06-13 DIAGNOSIS — Z9181 History of falling: Secondary | ICD-10-CM | POA: Diagnosis not present

## 2016-06-15 DIAGNOSIS — D492 Neoplasm of unspecified behavior of bone, soft tissue, and skin: Secondary | ICD-10-CM | POA: Diagnosis not present

## 2016-06-15 DIAGNOSIS — L728 Other follicular cysts of the skin and subcutaneous tissue: Secondary | ICD-10-CM | POA: Diagnosis not present

## 2016-06-15 DIAGNOSIS — D229 Melanocytic nevi, unspecified: Secondary | ICD-10-CM | POA: Diagnosis not present

## 2016-06-15 DIAGNOSIS — L570 Actinic keratosis: Secondary | ICD-10-CM | POA: Diagnosis not present

## 2016-06-15 DIAGNOSIS — L820 Inflamed seborrheic keratosis: Secondary | ICD-10-CM | POA: Diagnosis not present

## 2016-07-26 DIAGNOSIS — E215 Disorder of parathyroid gland, unspecified: Secondary | ICD-10-CM | POA: Diagnosis not present

## 2016-07-26 DIAGNOSIS — F329 Major depressive disorder, single episode, unspecified: Secondary | ICD-10-CM | POA: Diagnosis not present

## 2016-07-26 DIAGNOSIS — M810 Age-related osteoporosis without current pathological fracture: Secondary | ICD-10-CM | POA: Diagnosis not present

## 2016-07-26 DIAGNOSIS — K219 Gastro-esophageal reflux disease without esophagitis: Secondary | ICD-10-CM | POA: Diagnosis not present

## 2016-07-26 DIAGNOSIS — Z79899 Other long term (current) drug therapy: Secondary | ICD-10-CM | POA: Diagnosis not present

## 2016-07-26 DIAGNOSIS — M059 Rheumatoid arthritis with rheumatoid factor, unspecified: Secondary | ICD-10-CM | POA: Diagnosis not present

## 2016-07-26 DIAGNOSIS — Z7983 Long term (current) use of bisphosphonates: Secondary | ICD-10-CM | POA: Diagnosis not present

## 2016-09-04 IMAGING — MR MR KNEE*R* W/O CM
5 of 6 series · 33 of 40 positions shown · non-contrast
Comparison: Report from 03/27/2013.

CLINICAL DATA: Right knee instability for 1 year. This causes
falls. Difficulty ascending stairs.

EXAM:
MRI OF THE RIGHT KNEE WITHOUT CONTRAST
TECHNIQUE: Multiplanar, multisequence MR imaging of the knee was performed. No
intravenous contrast was administered.

[Series 6: PD fat-sat · axial · right · 3.0mm · 0.39mm/px · z∈[-26,+74]mm · 8 of 32 slices shown (1 of 3)]
[im 1/32]
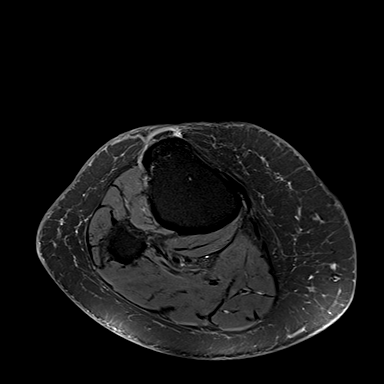
[im 5/32]
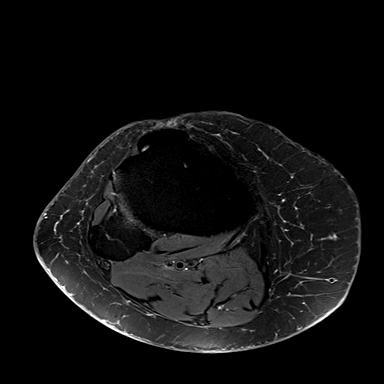
[im 9/32]
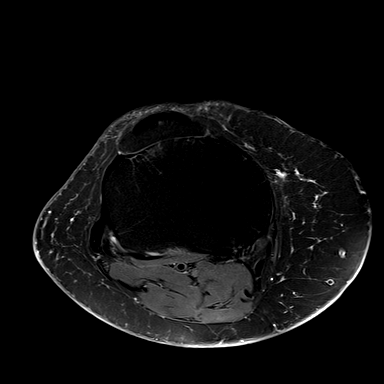
[im 14/32]
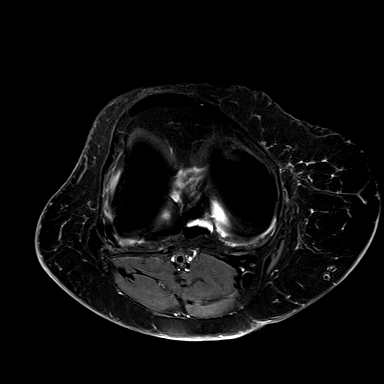
[im 18/32]
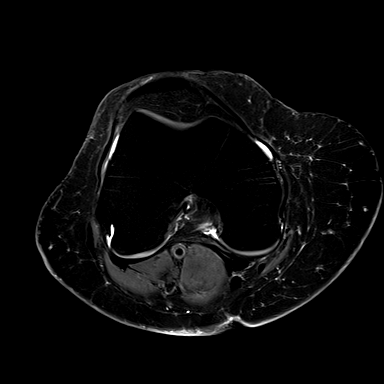
[im 23/32]
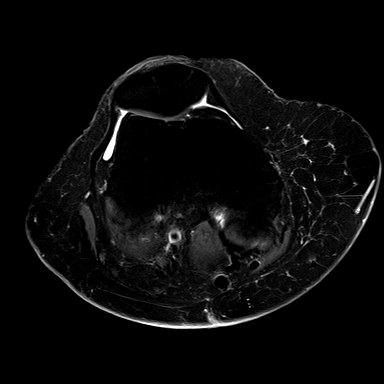
[im 27/32]
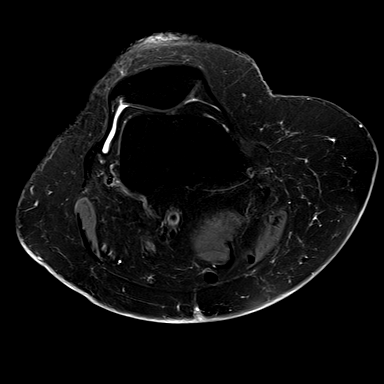
[im 32/32]
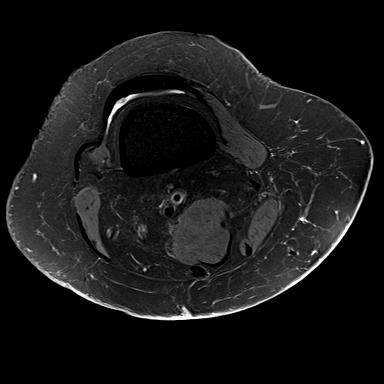

[Series 8: PD fat-sat · coronal · right · 3.0mm · 0.33mm/px · 7 of 28 slices shown (2 of 3)]
[im 1/28]
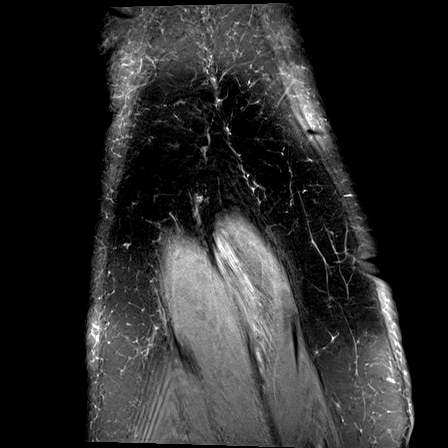
[im 5/28]
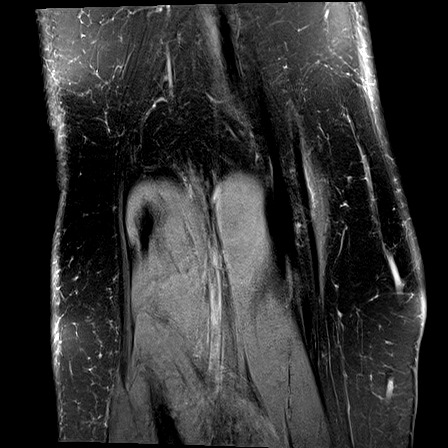
[im 10/28]
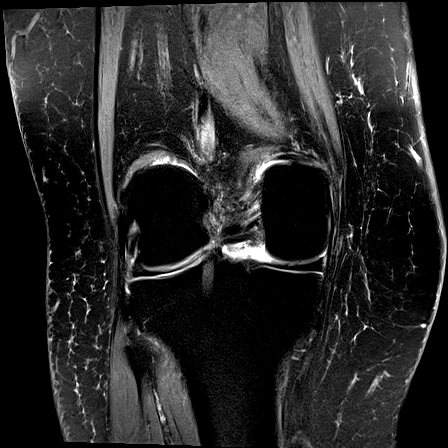
[im 14/28]
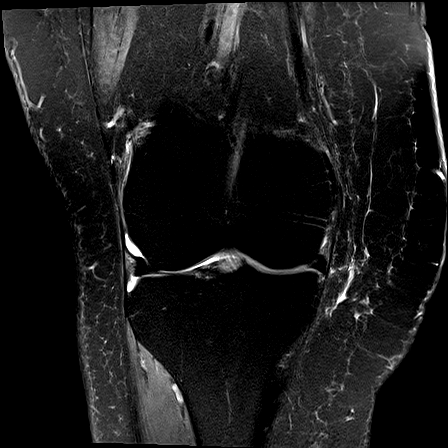
[im 19/28]
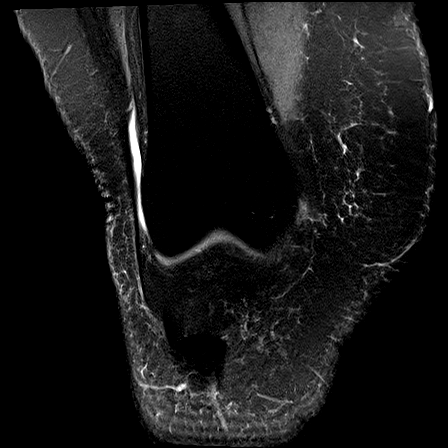
[im 23/28]
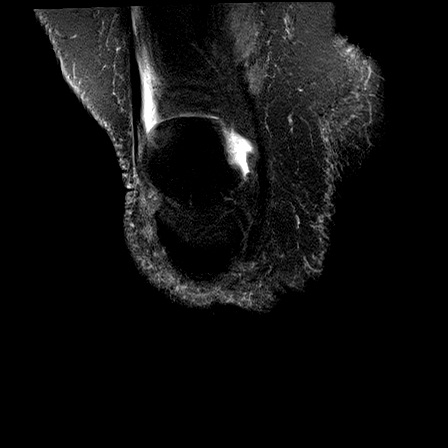
[im 28/28]
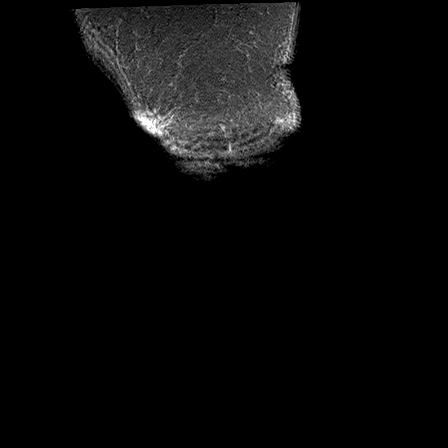

[Series 9: PD fat-sat · sagittal · right · 3.0mm · 0.39mm/px · 6 of 24 slices shown (3 of 3)]
[im 1/24]
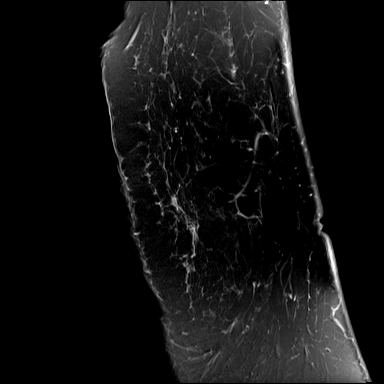
[im 5/24]
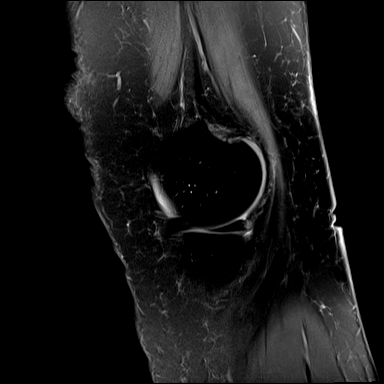
[im 10/24]
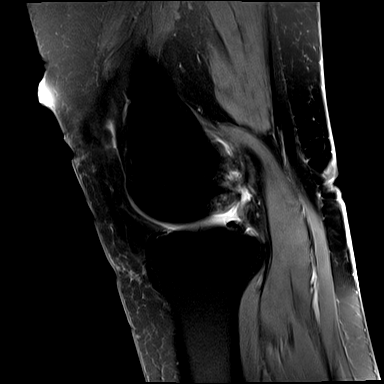
[im 14/24]
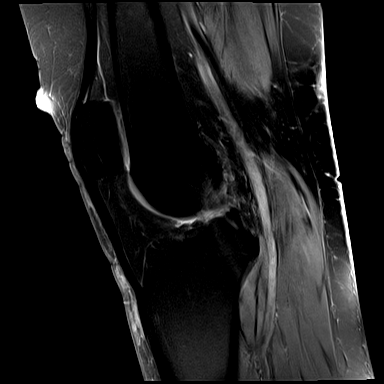
[im 19/24]
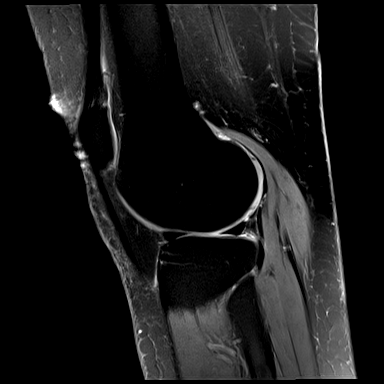
[im 24/24]
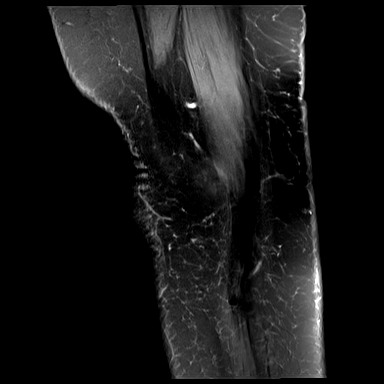

[Series 10: T2 fat-sat · coronal · right · 3.0mm · 0.39mm/px · 7 of 28 slices shown]
[im 1/28]
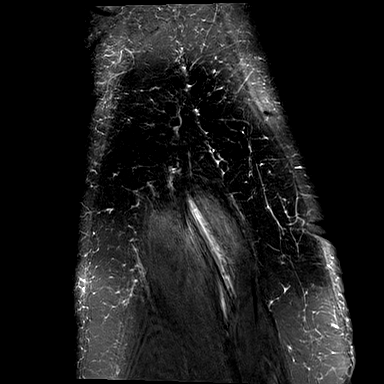
[im 5/28]
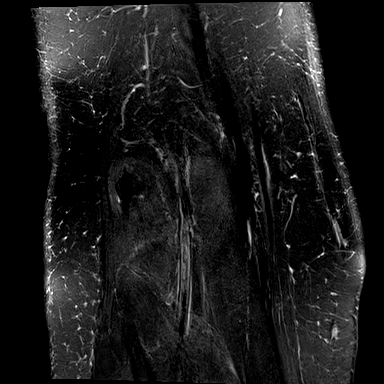
[im 10/28]
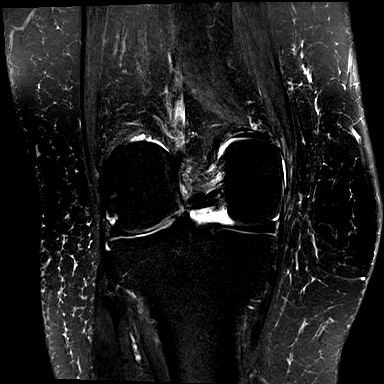
[im 14/28]
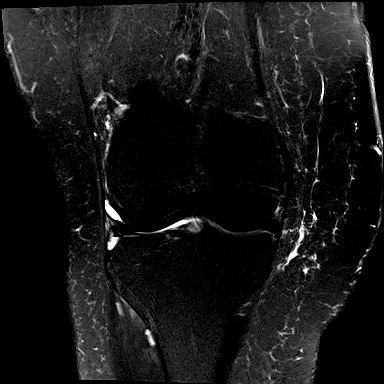
[im 19/28]
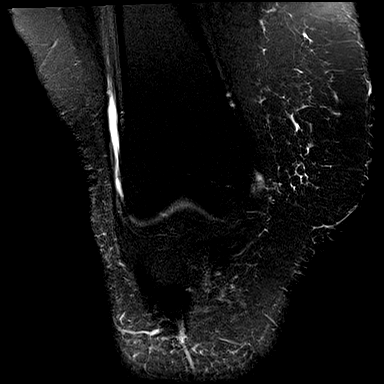
[im 23/28]
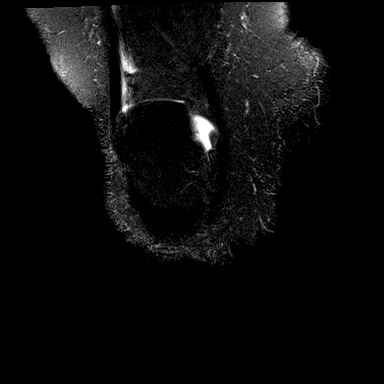
[im 28/28]
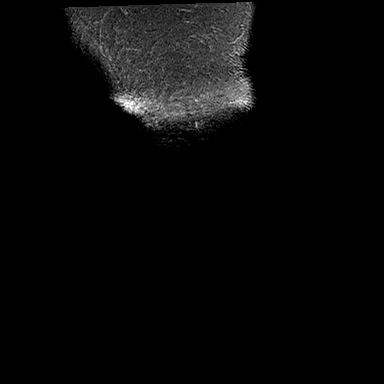

[Series 11: PD · coronal · right · 1.5mm · 0.44mm/px · 5 of 18 slices shown]
[im 1/18]
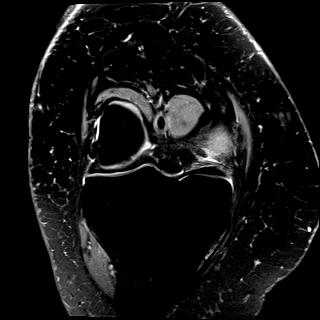
[im 5/18]
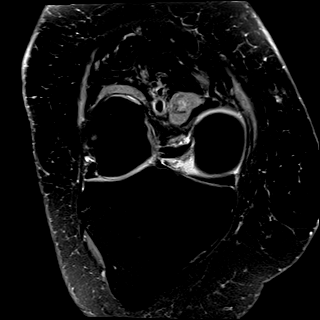
[im 9/18]
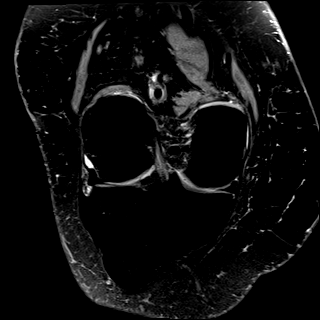
[im 13/18]
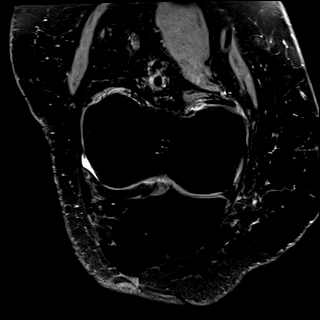
[im 18/18]
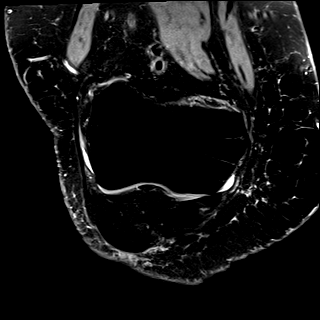

[33 of 40 positions shown; findings below may reference images not displayed]

FINDINGS: MENISCI

Medial meniscus:  Unremarkable

Lateral meniscus: Very small free edge and adjacent superior surface
tear involving the midbody lateral meniscus, images 13-14 series 11
and image 13 series 8.

LIGAMENTS

Cruciates:  Unremarkable

Collaterals:  Mild proximal popliteus tendinopathy.

CARTILAGE

Patellofemoral:  Moderate but smooth degenerative chondral thinning.

Medial: Moderate to prominent degenerative chondral thinning,
without irregularity.

Lateral: Moderate to prominent degenerative chondral thinning,
without irregularity.

Joint:  Trace knee effusion.

Popliteal Fossa:  Unremarkable

Extensor Mechanism:  Unremarkable

Bones:  Unremarkable
IMPRESSION: 1. Very small free edge and superior surface tear of the midbody
lateral meniscus.
2. Considerable chondral thinning but smoothly marginated in all 3
compartments.
3. Trace knee effusion.
4. Mild proximal popliteus tendinopathy.

## 2016-10-30 DIAGNOSIS — E782 Mixed hyperlipidemia: Secondary | ICD-10-CM | POA: Diagnosis not present

## 2016-10-30 DIAGNOSIS — E559 Vitamin D deficiency, unspecified: Secondary | ICD-10-CM | POA: Diagnosis not present

## 2016-10-30 DIAGNOSIS — M810 Age-related osteoporosis without current pathological fracture: Secondary | ICD-10-CM | POA: Diagnosis not present

## 2016-10-30 DIAGNOSIS — M0570 Rheumatoid arthritis with rheumatoid factor of unspecified site without organ or systems involvement: Secondary | ICD-10-CM | POA: Diagnosis not present

## 2016-10-30 DIAGNOSIS — N390 Urinary tract infection, site not specified: Secondary | ICD-10-CM | POA: Diagnosis not present

## 2016-10-31 DIAGNOSIS — K219 Gastro-esophageal reflux disease without esophagitis: Secondary | ICD-10-CM | POA: Diagnosis not present

## 2016-10-31 DIAGNOSIS — M069 Rheumatoid arthritis, unspecified: Secondary | ICD-10-CM | POA: Diagnosis not present

## 2016-10-31 DIAGNOSIS — M810 Age-related osteoporosis without current pathological fracture: Secondary | ICD-10-CM | POA: Diagnosis not present

## 2016-10-31 DIAGNOSIS — M059 Rheumatoid arthritis with rheumatoid factor, unspecified: Secondary | ICD-10-CM | POA: Diagnosis not present

## 2016-10-31 DIAGNOSIS — Z7983 Long term (current) use of bisphosphonates: Secondary | ICD-10-CM | POA: Diagnosis not present

## 2016-10-31 DIAGNOSIS — Z9181 History of falling: Secondary | ICD-10-CM | POA: Diagnosis not present

## 2016-10-31 DIAGNOSIS — Z79899 Other long term (current) drug therapy: Secondary | ICD-10-CM | POA: Diagnosis not present

## 2016-10-31 DIAGNOSIS — F329 Major depressive disorder, single episode, unspecified: Secondary | ICD-10-CM | POA: Diagnosis not present

## 2016-11-09 DIAGNOSIS — H2513 Age-related nuclear cataract, bilateral: Secondary | ICD-10-CM | POA: Diagnosis not present

## 2016-11-09 DIAGNOSIS — H40013 Open angle with borderline findings, low risk, bilateral: Secondary | ICD-10-CM | POA: Diagnosis not present

## 2016-11-09 DIAGNOSIS — I708 Atherosclerosis of other arteries: Secondary | ICD-10-CM | POA: Diagnosis not present

## 2016-11-09 DIAGNOSIS — H2512 Age-related nuclear cataract, left eye: Secondary | ICD-10-CM | POA: Diagnosis not present

## 2016-11-09 DIAGNOSIS — H25012 Cortical age-related cataract, left eye: Secondary | ICD-10-CM | POA: Diagnosis not present

## 2016-11-09 DIAGNOSIS — H25013 Cortical age-related cataract, bilateral: Secondary | ICD-10-CM | POA: Diagnosis not present

## 2016-12-15 DIAGNOSIS — M0570 Rheumatoid arthritis with rheumatoid factor of unspecified site without organ or systems involvement: Secondary | ICD-10-CM | POA: Diagnosis not present

## 2016-12-15 DIAGNOSIS — M810 Age-related osteoporosis without current pathological fracture: Secondary | ICD-10-CM | POA: Diagnosis not present

## 2016-12-15 DIAGNOSIS — E782 Mixed hyperlipidemia: Secondary | ICD-10-CM | POA: Diagnosis not present

## 2016-12-15 DIAGNOSIS — E559 Vitamin D deficiency, unspecified: Secondary | ICD-10-CM | POA: Diagnosis not present

## 2016-12-19 DIAGNOSIS — H2512 Age-related nuclear cataract, left eye: Secondary | ICD-10-CM | POA: Diagnosis not present

## 2016-12-22 DIAGNOSIS — E559 Vitamin D deficiency, unspecified: Secondary | ICD-10-CM | POA: Diagnosis not present

## 2016-12-22 DIAGNOSIS — M8580 Other specified disorders of bone density and structure, unspecified site: Secondary | ICD-10-CM | POA: Diagnosis not present

## 2016-12-22 DIAGNOSIS — M0570 Rheumatoid arthritis with rheumatoid factor of unspecified site without organ or systems involvement: Secondary | ICD-10-CM | POA: Diagnosis not present

## 2016-12-22 DIAGNOSIS — M810 Age-related osteoporosis without current pathological fracture: Secondary | ICD-10-CM | POA: Diagnosis not present

## 2017-01-11 DIAGNOSIS — H2511 Age-related nuclear cataract, right eye: Secondary | ICD-10-CM | POA: Diagnosis not present

## 2017-01-11 DIAGNOSIS — H25011 Cortical age-related cataract, right eye: Secondary | ICD-10-CM | POA: Diagnosis not present

## 2017-01-16 DIAGNOSIS — H25811 Combined forms of age-related cataract, right eye: Secondary | ICD-10-CM | POA: Diagnosis not present

## 2017-01-16 DIAGNOSIS — H2511 Age-related nuclear cataract, right eye: Secondary | ICD-10-CM | POA: Diagnosis not present

## 2017-01-23 DIAGNOSIS — G629 Polyneuropathy, unspecified: Secondary | ICD-10-CM | POA: Diagnosis not present

## 2017-01-23 DIAGNOSIS — M0589 Other rheumatoid arthritis with rheumatoid factor of multiple sites: Secondary | ICD-10-CM | POA: Diagnosis not present

## 2017-01-23 DIAGNOSIS — M150 Primary generalized (osteo)arthritis: Secondary | ICD-10-CM | POA: Diagnosis not present

## 2017-01-23 DIAGNOSIS — Z6829 Body mass index (BMI) 29.0-29.9, adult: Secondary | ICD-10-CM | POA: Diagnosis not present

## 2017-01-23 DIAGNOSIS — L401 Generalized pustular psoriasis: Secondary | ICD-10-CM | POA: Diagnosis not present

## 2017-01-23 DIAGNOSIS — M7062 Trochanteric bursitis, left hip: Secondary | ICD-10-CM | POA: Diagnosis not present

## 2017-01-23 DIAGNOSIS — M25561 Pain in right knee: Secondary | ICD-10-CM | POA: Diagnosis not present

## 2017-01-23 DIAGNOSIS — E663 Overweight: Secondary | ICD-10-CM | POA: Diagnosis not present

## 2017-01-23 DIAGNOSIS — R600 Localized edema: Secondary | ICD-10-CM | POA: Diagnosis not present

## 2017-02-13 DIAGNOSIS — Z1231 Encounter for screening mammogram for malignant neoplasm of breast: Secondary | ICD-10-CM | POA: Diagnosis not present

## 2017-02-19 DIAGNOSIS — Z961 Presence of intraocular lens: Secondary | ICD-10-CM | POA: Diagnosis not present

## 2017-02-19 DIAGNOSIS — H5022 Vertical strabismus, left eye: Secondary | ICD-10-CM | POA: Diagnosis not present

## 2017-02-19 DIAGNOSIS — H5231 Anisometropia: Secondary | ICD-10-CM | POA: Diagnosis not present

## 2017-05-01 DIAGNOSIS — H01003 Unspecified blepharitis right eye, unspecified eyelid: Secondary | ICD-10-CM | POA: Diagnosis not present

## 2017-05-01 DIAGNOSIS — H40013 Open angle with borderline findings, low risk, bilateral: Secondary | ICD-10-CM | POA: Diagnosis not present

## 2017-05-01 DIAGNOSIS — H01009 Unspecified blepharitis unspecified eye, unspecified eyelid: Secondary | ICD-10-CM | POA: Diagnosis not present

## 2017-05-01 DIAGNOSIS — H04123 Dry eye syndrome of bilateral lacrimal glands: Secondary | ICD-10-CM | POA: Diagnosis not present

## 2017-06-22 DIAGNOSIS — M810 Age-related osteoporosis without current pathological fracture: Secondary | ICD-10-CM | POA: Diagnosis not present

## 2017-06-22 DIAGNOSIS — M0570 Rheumatoid arthritis with rheumatoid factor of unspecified site without organ or systems involvement: Secondary | ICD-10-CM | POA: Diagnosis not present

## 2017-06-22 DIAGNOSIS — F329 Major depressive disorder, single episode, unspecified: Secondary | ICD-10-CM | POA: Diagnosis not present

## 2017-06-22 DIAGNOSIS — Z0001 Encounter for general adult medical examination with abnormal findings: Secondary | ICD-10-CM | POA: Diagnosis not present

## 2017-07-23 DIAGNOSIS — M25561 Pain in right knee: Secondary | ICD-10-CM | POA: Diagnosis not present

## 2017-07-23 DIAGNOSIS — Z6829 Body mass index (BMI) 29.0-29.9, adult: Secondary | ICD-10-CM | POA: Diagnosis not present

## 2017-07-23 DIAGNOSIS — L401 Generalized pustular psoriasis: Secondary | ICD-10-CM | POA: Diagnosis not present

## 2017-07-23 DIAGNOSIS — M0589 Other rheumatoid arthritis with rheumatoid factor of multiple sites: Secondary | ICD-10-CM | POA: Diagnosis not present

## 2017-07-23 DIAGNOSIS — M7062 Trochanteric bursitis, left hip: Secondary | ICD-10-CM | POA: Diagnosis not present

## 2017-07-23 DIAGNOSIS — G629 Polyneuropathy, unspecified: Secondary | ICD-10-CM | POA: Diagnosis not present

## 2017-07-23 DIAGNOSIS — E663 Overweight: Secondary | ICD-10-CM | POA: Diagnosis not present

## 2017-07-23 DIAGNOSIS — M150 Primary generalized (osteo)arthritis: Secondary | ICD-10-CM | POA: Diagnosis not present

## 2017-07-23 DIAGNOSIS — R600 Localized edema: Secondary | ICD-10-CM | POA: Diagnosis not present

## 2017-07-24 DIAGNOSIS — M810 Age-related osteoporosis without current pathological fracture: Secondary | ICD-10-CM | POA: Diagnosis not present

## 2017-07-24 DIAGNOSIS — Z79899 Other long term (current) drug therapy: Secondary | ICD-10-CM | POA: Diagnosis not present

## 2017-07-24 DIAGNOSIS — Z9181 History of falling: Secondary | ICD-10-CM | POA: Diagnosis not present

## 2017-07-24 DIAGNOSIS — Z7983 Long term (current) use of bisphosphonates: Secondary | ICD-10-CM | POA: Diagnosis not present

## 2017-07-24 DIAGNOSIS — M069 Rheumatoid arthritis, unspecified: Secondary | ICD-10-CM | POA: Diagnosis not present

## 2017-07-24 DIAGNOSIS — M059 Rheumatoid arthritis with rheumatoid factor, unspecified: Secondary | ICD-10-CM | POA: Diagnosis not present

## 2017-07-29 DIAGNOSIS — N3001 Acute cystitis with hematuria: Secondary | ICD-10-CM | POA: Diagnosis not present

## 2017-08-22 DIAGNOSIS — L603 Nail dystrophy: Secondary | ICD-10-CM | POA: Diagnosis not present

## 2017-08-22 DIAGNOSIS — L820 Inflamed seborrheic keratosis: Secondary | ICD-10-CM | POA: Diagnosis not present

## 2017-08-22 DIAGNOSIS — L219 Seborrheic dermatitis, unspecified: Secondary | ICD-10-CM | POA: Diagnosis not present

## 2017-08-22 DIAGNOSIS — D485 Neoplasm of uncertain behavior of skin: Secondary | ICD-10-CM | POA: Diagnosis not present

## 2017-10-15 DIAGNOSIS — M810 Age-related osteoporosis without current pathological fracture: Secondary | ICD-10-CM | POA: Diagnosis not present

## 2017-10-15 DIAGNOSIS — E782 Mixed hyperlipidemia: Secondary | ICD-10-CM | POA: Diagnosis not present

## 2017-10-15 DIAGNOSIS — E559 Vitamin D deficiency, unspecified: Secondary | ICD-10-CM | POA: Diagnosis not present

## 2017-10-15 DIAGNOSIS — M0570 Rheumatoid arthritis with rheumatoid factor of unspecified site without organ or systems involvement: Secondary | ICD-10-CM | POA: Diagnosis not present

## 2017-11-12 DIAGNOSIS — E663 Overweight: Secondary | ICD-10-CM | POA: Diagnosis not present

## 2017-11-12 DIAGNOSIS — G629 Polyneuropathy, unspecified: Secondary | ICD-10-CM | POA: Diagnosis not present

## 2017-11-12 DIAGNOSIS — M7062 Trochanteric bursitis, left hip: Secondary | ICD-10-CM | POA: Diagnosis not present

## 2017-11-12 DIAGNOSIS — Z6829 Body mass index (BMI) 29.0-29.9, adult: Secondary | ICD-10-CM | POA: Diagnosis not present

## 2017-11-12 DIAGNOSIS — R600 Localized edema: Secondary | ICD-10-CM | POA: Diagnosis not present

## 2017-11-12 DIAGNOSIS — M0589 Other rheumatoid arthritis with rheumatoid factor of multiple sites: Secondary | ICD-10-CM | POA: Diagnosis not present

## 2017-11-12 DIAGNOSIS — M150 Primary generalized (osteo)arthritis: Secondary | ICD-10-CM | POA: Diagnosis not present

## 2017-11-12 DIAGNOSIS — L401 Generalized pustular psoriasis: Secondary | ICD-10-CM | POA: Diagnosis not present

## 2017-11-26 DIAGNOSIS — H40013 Open angle with borderline findings, low risk, bilateral: Secondary | ICD-10-CM | POA: Diagnosis not present

## 2017-11-26 DIAGNOSIS — H532 Diplopia: Secondary | ICD-10-CM | POA: Diagnosis not present

## 2017-11-26 DIAGNOSIS — I708 Atherosclerosis of other arteries: Secondary | ICD-10-CM | POA: Diagnosis not present

## 2017-11-26 DIAGNOSIS — Z79899 Other long term (current) drug therapy: Secondary | ICD-10-CM | POA: Diagnosis not present

## 2020-04-07 ENCOUNTER — Encounter: Payer: Self-pay | Admitting: Physician Assistant

## 2020-04-07 ENCOUNTER — Other Ambulatory Visit: Payer: Self-pay

## 2020-04-07 ENCOUNTER — Ambulatory Visit (INDEPENDENT_AMBULATORY_CARE_PROVIDER_SITE_OTHER): Payer: Medicare Other | Admitting: Physician Assistant

## 2020-04-07 DIAGNOSIS — L820 Inflamed seborrheic keratosis: Secondary | ICD-10-CM

## 2020-04-07 DIAGNOSIS — L218 Other seborrheic dermatitis: Secondary | ICD-10-CM | POA: Diagnosis not present

## 2020-04-07 DIAGNOSIS — D485 Neoplasm of uncertain behavior of skin: Secondary | ICD-10-CM

## 2020-04-07 MED ORDER — KETOCONAZOLE 2 % EX CREA: 1.0000 "application " | TOPICAL_CREAM | Freq: Two times a day (BID) | CUTANEOUS | 10 refills | Status: AC

## 2020-04-07 MED ORDER — KETOCONAZOLE 2 % EX SHAM: MEDICATED_SHAMPOO | CUTANEOUS | 8 refills | Status: AC

## 2020-04-07 NOTE — Patient Instructions (Signed)

## 2020-04-07 NOTE — Progress Notes (Signed)
   Follow-Up Visit   Subjective  Shannon [name] is a 80 y.o. female who presents for the following: Psoriasis (Patches on left leg & belly button  tx-can't remember name).   The following portions of the chart were reviewed this encounter and updated as appropriate:  Tobacco  Allergies  Meds  Problems  Med Hx  Surg Hx  Fam Hx      Objective  Well appearing patient in no apparent distress; mood and affect are within normal limits.  A full examination was performed including scalp, head, eyes, ears, nose, lips, neck, chest, axillae, abdomen, back, buttocks, bilateral upper extremities, bilateral lower extremities, hands, feet, fingers, toes, fingernails, and toenails. All findings within normal limits unless otherwise noted below.  Objective  Right Parotid Area: No measurement/ photo per ks   Assessment & Plan  Other seborrheic dermatitis Mid Forehead, scalp  ketoconazole (NIZORAL) 2 % shampoo - Mid Forehead, scalp  ketoconazole (NIZORAL) 2 % cream - Mid Forehead, scalp  Neoplasm of uncertain behavior of skin Right Parotid Area  Skin / nail biopsy Type of biopsy: tangential   Informed consent: discussed and consent obtained   Timeout: patient name, date of birth, surgical site, and procedure verified   Procedure prep:  Patient was prepped and draped in usual sterile fashion (Non sterile) Prep type:  Chlorhexidine Anesthesia: the lesion was anesthetized in a standard fashion   Anesthetic:  1% lidocaine w/ epinephrine 1-100,000 local infiltration Instrument used: flexible razor blade   Outcome: patient tolerated procedure well   Post-procedure details: wound care instructions given    Specimen 1 - Surgical pathology Differential Diagnosis: r/o sk  Check Margins: No    I, Daeshon Grammatico, PA-C, have reviewed all documentation's for this visit.  The documentation on 04/07/20 for the exam, diagnosis, procedures and orders are all accurate and complete.

## 2021-03-01 ENCOUNTER — Ambulatory Visit: Payer: Medicare Other | Admitting: Physician Assistant
